# Patient Record
Sex: Male | Born: 1975 | Race: White | Hispanic: No | Marital: Married | Smoking: Never smoker
Health system: Southern US, Community
[De-identification: ages and names within clinical notes are randomized; demographics above are authoritative.]

## PROBLEM LIST (undated history)

## (undated) DIAGNOSIS — M549 Dorsalgia, unspecified: Secondary | ICD-10-CM

## (undated) DIAGNOSIS — G8929 Other chronic pain: Secondary | ICD-10-CM

## (undated) DIAGNOSIS — J302 Other seasonal allergic rhinitis: Secondary | ICD-10-CM

## (undated) HISTORY — PX: BURR HOLE FOR SUBDURAL HEMATOMA: SHX1275

## (undated) HISTORY — PX: TONSILLECTOMY: SUR1361

---

## 2001-03-21 ENCOUNTER — Ambulatory Visit (HOSPITAL_COMMUNITY): Admission: RE | Admit: 2001-03-21 | Discharge: 2001-03-21 | Payer: Self-pay | Admitting: Family Medicine

## 2012-05-29 ENCOUNTER — Encounter (HOSPITAL_BASED_OUTPATIENT_CLINIC_OR_DEPARTMENT_OTHER): Payer: Self-pay | Admitting: Emergency Medicine

## 2012-05-29 ENCOUNTER — Emergency Department (HOSPITAL_BASED_OUTPATIENT_CLINIC_OR_DEPARTMENT_OTHER): Payer: Federal, State, Local not specified - PPO

## 2012-05-29 ENCOUNTER — Emergency Department (HOSPITAL_BASED_OUTPATIENT_CLINIC_OR_DEPARTMENT_OTHER)
Admission: EM | Admit: 2012-05-29 | Discharge: 2012-05-30 | Disposition: A | Discharge status: Home / Self Care | Payer: Federal, State, Local not specified - PPO | Attending: Emergency Medicine | Admitting: Emergency Medicine

## 2012-05-29 DIAGNOSIS — G8929 Other chronic pain: Secondary | ICD-10-CM | POA: Insufficient documentation

## 2012-05-29 DIAGNOSIS — R209 Unspecified disturbances of skin sensation: Secondary | ICD-10-CM | POA: Insufficient documentation

## 2012-05-29 DIAGNOSIS — M549 Dorsalgia, unspecified: Secondary | ICD-10-CM | POA: Insufficient documentation

## 2012-05-29 HISTORY — DX: Other chronic pain: G89.29

## 2012-05-29 HISTORY — DX: Other seasonal allergic rhinitis: J30.2

## 2012-05-29 HISTORY — DX: Dorsalgia, unspecified: M54.9

## 2012-05-29 MED ORDER — METHYLPREDNISOLONE SODIUM SUCC 125 MG IJ SOLR
125.0000 mg | Freq: Once | INTRAMUSCULAR | Status: AC
  Administered 2012-05-29: 125 mg via INTRAMUSCULAR
  Filled 2012-05-29: qty 2

## 2012-05-29 MED ORDER — HYDROMORPHONE HCL PF 2 MG/ML IJ SOLN
2.0000 mg | Freq: Once | INTRAMUSCULAR | Status: AC
  Administered 2012-05-29: 2 mg via INTRAMUSCULAR
  Filled 2012-05-29: qty 1

## 2012-05-29 MED ORDER — KETOROLAC TROMETHAMINE 60 MG/2ML IM SOLN
60.0000 mg | Freq: Once | INTRAMUSCULAR | Status: AC
Start: 2012-05-29 — End: 2012-05-29
  Administered 2012-05-29: 60 mg via INTRAMUSCULAR
  Filled 2012-05-29: qty 2

## 2012-05-29 NOTE — ED Notes (Signed)
Pt c/o lower back pain. Pt states he lifted right leg in shower and felt pop in lower back with pain.

## 2012-05-29 NOTE — ED Provider Notes (Signed)
History     CSN: [id number]  Arrival date & time 05/29/12  2124   None     Chief Complaint  Patient presents with  . Back Injury    (Consider location/radiation/quality/duration/timing/severity/associated sxs/prior treatment) Patient is a 36 y.o. male presenting with back pain. The history is provided by the patient. No language interpreter was used.  Back Pain  This is a new problem. The problem occurs constantly. The problem has been gradually worsening. The pain is associated with no known injury. The pain is present in the lumbar spine. The quality of the pain is described as aching. The pain is at a severity of 6/10. The pain is moderate. The symptoms are aggravated by bending. Associated symptoms include tingling. He has tried nothing for the symptoms.  Pt reports he has a history of sciatica.  Pt reports he lifted his leg in the shower and had severe pain in his back.  Past Medical History  Diagnosis Date  . Chronic back pain   . Seasonal allergies     History reviewed. No pertinent past surgical history.  No family history on file.  History  Substance Use Topics  . Smoking status: Not on file  . Smokeless tobacco: Not on file  . Alcohol Use:       Review of Systems  Musculoskeletal: Positive for back pain.  Neurological: Positive for tingling.  All other systems reviewed and are negative.    Allergies  Amoxil  Home Medications  No current outpatient prescriptions on file.  BP 129/79  Pulse 77  Temp 98.6 F (37 C) (Oral)  Resp 16  Ht 5\' 9"  (1.753 m)  Wt 206 lb (93.441 kg)  BMI 30.42 kg/m2  SpO2 98%  Physical Exam  Nursing note and vitals reviewed. Constitutional: He is oriented to person, place, and time. He appears well-developed and well-nourished.  HENT:  Head: Normocephalic.  Neck: Normal range of motion.  Cardiovascular: Normal rate and normal heart sounds.   Pulmonary/Chest: Effort normal.  Abdominal: Soft.  Musculoskeletal: Normal  range of motion.       Tender lower lumbar spine  nv and ns intact  Neurological: He is alert and oriented to person, place, and time. He has normal reflexes.  Skin: Skin is warm.    ED Course  Procedures (including critical care time)  Labs Reviewed - No data to display Dg Lumbar Spine Complete  05/29/2012  *RADIOLOGY REPORT*  Clinical Data: Low back pain  LUMBAR SPINE - COMPLETE 4+ VIEW  Comparison: None.  Findings: There is no evidence of lumbar spine fracture.  Alignment is normal.  Intervertebral disc spaces are maintained.  IMPRESSION: Negative.   Original Report Authenticated By: Osa Craver, M.D.      No diagnosis found.    MDM  Pt given torodol, dilaudid and solumedrol.    I advised pt to see his Md for recheck on Monday.  Pt given number for Dr. August Saucer for follow up.  Pt given rx for Percocet and voltaren        Lonia Skinner [place], [place] 05/29/12 2341

## 2012-05-30 MED ORDER — OXYCODONE-ACETAMINOPHEN 5-325 MG PO TABS: 2.0000 | ORAL_TABLET | ORAL | Status: DC | PRN

## 2012-05-30 MED ORDER — IBUPROFEN 800 MG PO TABS: 800.0000 mg | ORAL_TABLET | Freq: Three times a day (TID) | ORAL | Status: DC

## 2012-05-30 NOTE — ED Provider Notes (Signed)
Medical screening examination/treatment/procedure(s) were performed by non-physician practitioner and as supervising physician I was immediately available for consultation/collaboration.   Hanley Seamen, MD 05/30/12 [phone]

## 2016-08-16 ENCOUNTER — Emergency Department (HOSPITAL_BASED_OUTPATIENT_CLINIC_OR_DEPARTMENT_OTHER)
Admission: EM | Admit: 2016-08-16 | Discharge: 2016-08-17 | Disposition: A | Discharge status: Home / Self Care | Payer: Federal, State, Local not specified - PPO | Attending: Emergency Medicine | Admitting: Emergency Medicine

## 2016-08-16 ENCOUNTER — Encounter (HOSPITAL_BASED_OUTPATIENT_CLINIC_OR_DEPARTMENT_OTHER): Payer: Self-pay | Admitting: Emergency Medicine

## 2016-08-16 DIAGNOSIS — F1290 Cannabis use, unspecified, uncomplicated: Secondary | ICD-10-CM

## 2016-08-16 DIAGNOSIS — R410 Disorientation, unspecified: Secondary | ICD-10-CM | POA: Insufficient documentation

## 2016-08-16 DIAGNOSIS — Z79899 Other long term (current) drug therapy: Secondary | ICD-10-CM | POA: Insufficient documentation

## 2016-08-16 DIAGNOSIS — R0789 Other chest pain: Secondary | ICD-10-CM | POA: Diagnosis not present

## 2016-08-16 DIAGNOSIS — F1210 Cannabis abuse, uncomplicated: Secondary | ICD-10-CM | POA: Insufficient documentation

## 2016-08-16 DIAGNOSIS — R42 Dizziness and giddiness: Secondary | ICD-10-CM | POA: Diagnosis not present

## 2016-08-16 DIAGNOSIS — R079 Chest pain, unspecified: Secondary | ICD-10-CM

## 2016-08-16 MED ORDER — LORAZEPAM 1 MG PO TABS: 1.0000 mg | ORAL_TABLET | Freq: Once | ORAL | Status: DC

## 2016-08-16 NOTE — ED Triage Notes (Signed)
Patient states that he got a "rice krispie treat with mariajuana in it"  [employer] today. He states that about an hour after he consumed it he has had chest burning and pain, wife reports that he is disoriented.

## 2016-08-16 NOTE — ED Provider Notes (Signed)
[facility] Provider Note   CSN: [id number] Arrival date & time: 08/16/16  2300  By signing my name below, I, Nathan Monroe, attest that this documentation has been prepared under the direction and in the presence of Nathan Batonourtney F Yazir Koerber, MD . Electronically Signed: Modena JanskyAlbert Monroe, Scribe. 08/16/2016. 11:33 PM.  History   Chief Complaint Chief Complaint  Patient presents with  . Chest Pain   The history is provided by the patient. No language interpreter was used.   HPI Comments: Nathan Monroe is a 40 y.o. male who presents to the Emergency Department complaining of constant moderate chest pain that started about 2 hours ago. He states he ate a "rice krispie with marijuana in it". He has no prior experience with the drug so he is concerned about his symptoms. He currently rates the pain as a 3/10. He reports associated dizziness and confusion. He states his symptoms are gradually improving with no modifying factors. He denies any hx of cardiac problems or other complaints.   Past Medical History:  Diagnosis Date  . Chronic back pain   . Seasonal allergies     There are no active problems to display for this patient.   History reviewed. No pertinent surgical history.     Home Medications    Prior to Admission medications   Medication Sig Start Date End Date Taking? Authorizing Provider  ibuprofen (ADVIL,MOTRIN) 800 MG tablet Take 1 tablet (800 mg total) by mouth 3 (three) times daily. 05/30/12   Elson AreasLeslie K Sofia, PA-C  oxyCODONE-acetaminophen (PERCOCET/ROXICET) 5-325 MG per tablet Take 2 tablets by mouth every 4 (four) hours as needed for pain. 05/30/12   Elson AreasLeslie K Sofia, PA-C    Family History No family history on file.  Social History Social History  Substance Use Topics  . Smoking status: Never Smoker  . Smokeless tobacco: Never Used  . Alcohol use Yes     Comment: occ     Allergies   Amoxil [amoxicillin]   Review of Systems Review of Systems    Constitutional: Negative for fever.  Respiratory: Negative for shortness of breath.   Cardiovascular: Positive for chest pain.  Gastrointestinal: Negative for abdominal pain, nausea and vomiting.  Neurological: Positive for dizziness.  Psychiatric/Behavioral: Positive for confusion.  All other systems reviewed and are negative.    Physical Exam Updated Vital Signs Ht 5\' 7"  (1.702 m)   Wt 210 lb (95.3 kg)   BMI 32.89 kg/m   Physical Exam  Constitutional: He is oriented to person, place, and time. He appears well-developed and well-nourished. No distress.  HENT:  Head: Normocephalic and atraumatic.  Eyes: Pupils are equal, round, and reactive to light.  Pupils 5 mm and reactive bilaterally  Cardiovascular: Normal rate, regular rhythm and normal heart sounds.   No murmur heard. Pulmonary/Chest: Effort normal and breath sounds normal. No respiratory distress. He has no wheezes. He exhibits no tenderness.  Abdominal: Soft. There is no tenderness.  Musculoskeletal: He exhibits no edema.  Neurological: He is alert and oriented to person, place, and time.  Skin: Skin is warm and dry.  Psychiatric: He has a normal mood and affect.  Nursing note and vitals reviewed.    ED Treatments / Results  DIAGNOSTIC STUDIES:   COORDINATION OF CARE: 11:40 PM- Pt advised of plan for treatment and pt agrees.  Labs (all labs ordered are listed, but only abnormal results are displayed) Labs Reviewed  RAPID URINE DRUG SCREEN, HOSP PERFORMED - Abnormal; Notable for the  following:       Result Value   Tetrahydrocannabinol POSITIVE (*)    All other components within normal limits  TROPONIN I    EKG  EKG Interpretation  Date/Time:  Saturday August 16 2016 23:11:07 EST Ventricular Rate:  105 PR Interval:    QRS Duration: 92 QT Interval:  314 QTC Calculation: 415 R Axis:   93 Text Interpretation:  Sinus tachycardia Borderline right axis deviation Confirmed by Wilkie AyeHORTON  MD, Thu Baggett  [phone]([id number]) on 08/16/2016 11:34:37 PM       Radiology Dg Chest 2 View  Result Date: 08/17/2016 CLINICAL DATA:  Chest pain after eating. EXAM: CHEST  2 VIEW COMPARISON:  None. FINDINGS: The heart size and mediastinal contours are within normal limits. Both lungs are clear. The visualized skeletal structures are unremarkable. IMPRESSION: No active cardiopulmonary disease. Electronically Signed   By: Nathan Monroe M.D.   On: 08/17/2016 00:15    Procedures Procedures (including critical care time)  Medications Ordered in ED Medications  LORazepam (ATIVAN) tablet 1 mg (not administered)     Initial Impression / Assessment and Plan / ED Course  I have reviewed the triage vital signs and the nursing notes.  Pertinent labs & imaging results that were available during my care of the patient were reviewed by me and considered in my medical decision making (see chart for details).  Clinical Course     Patient presents with chest pain, dizziness, and confusion. This started after ingesting marijuana. He is nontoxic. Low-risk ACS. Sinus tachycardia on exam but endorses anxiety. Low suspicion for PE. EKG is nonischemic and chest x-ray is reassuring. Positive THC on UDS. Patient offered Ativan but declined. Feels symptoms are likely related to marijuana ingestion.  After history, exam, and medical workup I feel the patient has been appropriately medically screened and is safe for discharge home. Pertinent diagnoses were discussed with the patient. Patient was given return precautions.   Final Clinical Impressions(s) / ED Diagnoses   Final diagnoses:  Chest pain, unspecified type  Marijuana use    New Prescriptions New Prescriptions   No medications on file   I personally performed the services described in this documentation, which was scribed in my presence. The recorded information has been reviewed and is accurate.     Nathan Batonourtney F Nathan Kromer, MD 08/17/16 [phone]

## 2016-08-17 ENCOUNTER — Emergency Department (HOSPITAL_BASED_OUTPATIENT_CLINIC_OR_DEPARTMENT_OTHER): Payer: Federal, State, Local not specified - PPO

## 2016-08-17 LAB — RAPID URINE DRUG SCREEN, HOSP PERFORMED
Amphetamines: NOT DETECTED
Barbiturates: NOT DETECTED
Benzodiazepines: NOT DETECTED
Cocaine: NOT DETECTED
OPIATES: NOT DETECTED
Tetrahydrocannabinol: POSITIVE — AB

## 2016-08-17 LAB — TROPONIN I

## 2016-08-17 NOTE — ED Notes (Signed)
Removed I.V. in right A.C. no issues at this time.

## 2016-08-17 NOTE — ED Notes (Signed)
Offered Ativan tablet to pt per order, pt declines at this time.

## 2016-08-17 NOTE — Discharge Instructions (Signed)
Your symptoms today were likely related to your marijuana use.

## 2017-07-14 IMAGING — CR DG CHEST 2V
2 series · 2 of 2 positions shown · non-contrast
Comparison: None.

CLINICAL DATA: Chest pain after eating.

EXAM:
CHEST  2 VIEW

[w chest pa]
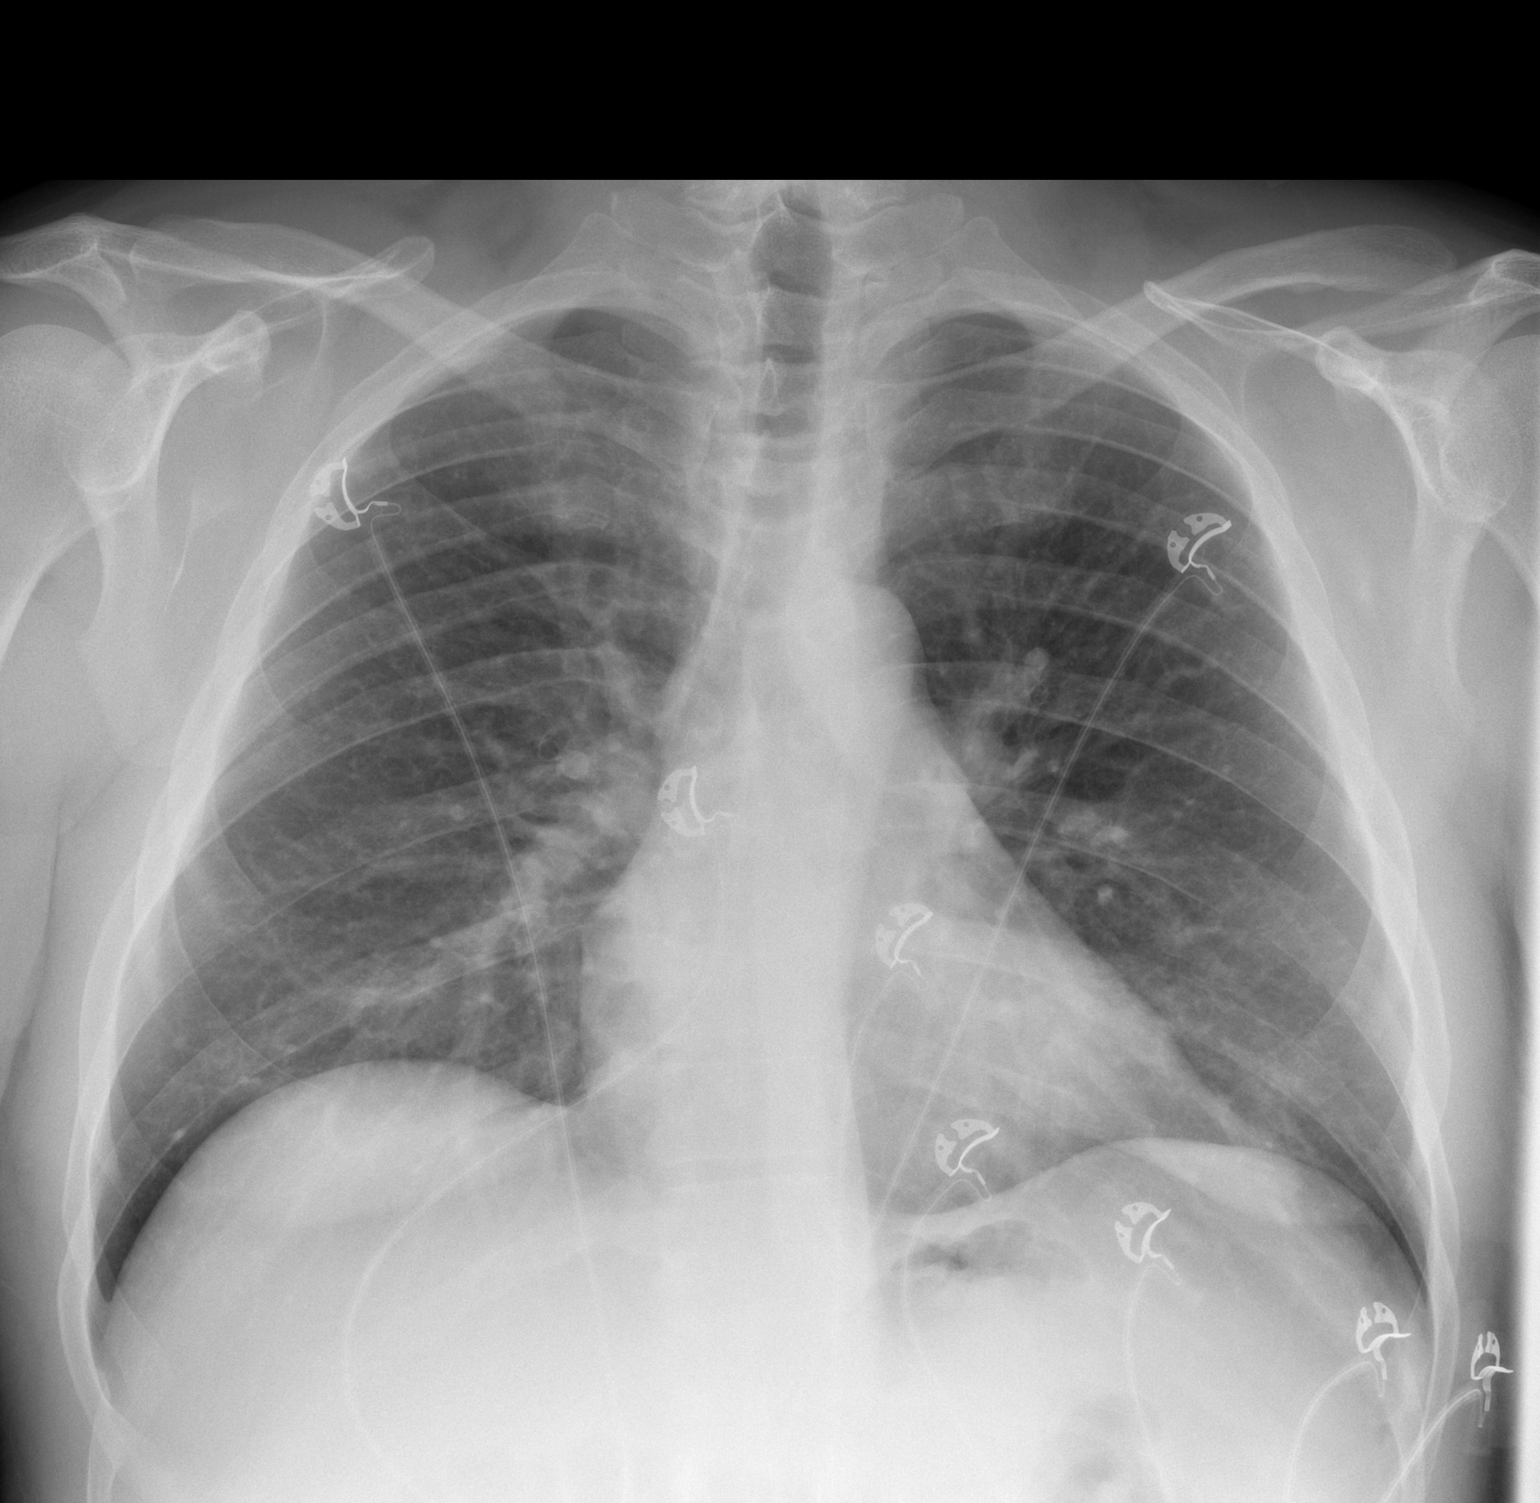

[w chest lat]
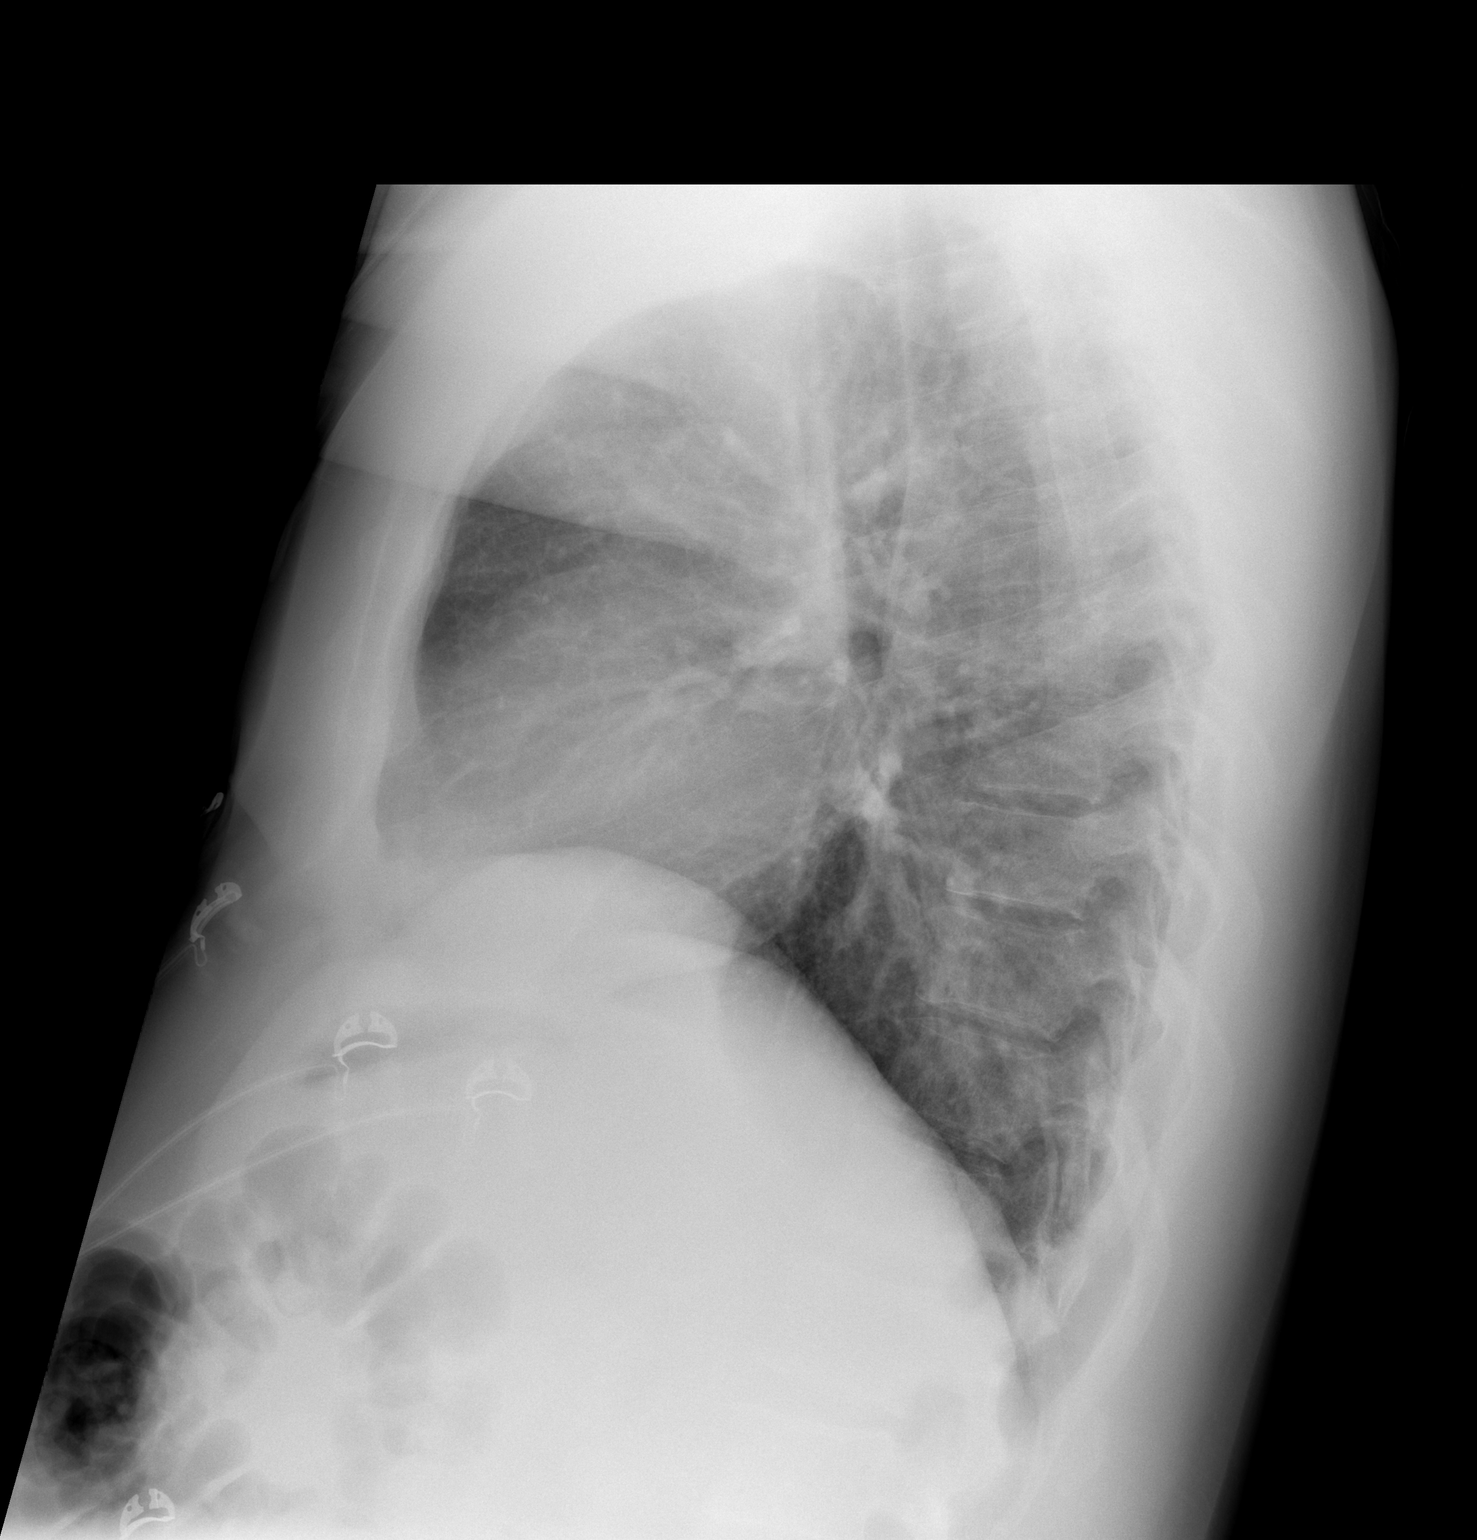

[2 of 2 positions shown; findings below may reference images not displayed]

FINDINGS: The heart size and mediastinal contours are within normal limits.
Both lungs are clear. The visualized skeletal structures are
unremarkable.
IMPRESSION: No active cardiopulmonary disease.

## 2018-11-01 ENCOUNTER — Encounter: Payer: Self-pay | Admitting: Neurology

## 2018-11-03 ENCOUNTER — Encounter: Payer: Self-pay | Admitting: Neurology

## 2018-11-03 ENCOUNTER — Other Ambulatory Visit: Payer: Self-pay

## 2018-11-03 ENCOUNTER — Ambulatory Visit: Payer: Federal, State, Local not specified - PPO | Admitting: Neurology

## 2018-11-03 VITALS — BP 122/84 | HR 83 | Ht 68.0 in | Wt 205.0 lb

## 2018-11-03 DIAGNOSIS — F514 Sleep terrors [night terrors]: Secondary | ICD-10-CM

## 2018-11-03 DIAGNOSIS — G4727 Circadian rhythm sleep disorder in conditions classified elsewhere: Secondary | ICD-10-CM | POA: Diagnosis not present
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
## 2019-02-07 ENCOUNTER — Telehealth: Payer: Self-pay

## 2019-02-07 NOTE — Telephone Encounter (Signed)
We have attempted to call the patient two times to schedule sleep study.  Patient has been unavailable at the phone numbers we have on file and has not returned our calls.  We have left detailed messages asking for a return call back asap.  If patient calls back we will schedule them for their sleep study.

## 2020-01-12 ENCOUNTER — Ambulatory Visit: Payer: Federal, State, Local not specified - PPO | Admitting: Allergy and Immunology

## 2020-04-27 ENCOUNTER — Ambulatory Visit: Payer: Self-pay | Admitting: *Deleted

## 2020-04-27 NOTE — Telephone Encounter (Signed)
Caller has questions about O2 levels due to + covid. C/o cough and nasal congestion persisting 11 days now. O2 levels at 95-97% on RA per patient. Denies SOB. Care advise given. Isolation precautions reviewed. Patient verbalized understanding of care advise and to call back or go to [facility] or ED if symptoms worsen.   Reason for Disposition . Long COVID (e.g., post-COVID syndrome), questions about  Answer Assessment - Initial Assessment Questions 1. COVID-19 ONSET: "When did the symptoms of COVID-19 first start?"     Greater than 11 days ago  3. MAIN SYMPTOM:  "What is your main concern or symptom right now?" (e.g., breathing difficulty, cough, fatigue. loss of smell)     When should I be concerned about my oxygen levels? 5. BETTER-SAME-WORSE: "Are you getting better, staying the same, or getting worse over the last 1 to 2 weeks?"     A little better continues to cough and have congestion 6. RECENT MEDICAL VISIT: "Have you been seen by a healthcare provider (doctor, NP, PA) for these persisting COVID-19 symptoms?" If Yes, ask: "When were you seen?" (e.g., date)     na 7. COUGH: "Do you have a cough?" If Yes, ask: "How bad is the cough?"       Yes. Bad at times 8. FEVER: "Do you have a fever?" If Yes, ask: "What is your temperature, how was it measured, and when did it start?"     na 9. BREATHING DIFFICULTY: "Are you having any trouble breathing?" If Yes, ask: "How bad is your breathing?" (e.g., mild, moderate, severe)    - MILD: No SOB at rest, mild SOB with walking, speaks normally in sentences, can lay down, no retractions, pulse < 100.    - MODERATE: SOB at rest, SOB with minimal exertion and prefers to sit, cannot lie down flat, speaks in phrases, mild retractions, audible wheezing, pulse 100-120.    - SEVERE: Very SOB at rest, speaks in single words, struggling to breathe, sitting hunched forward, retractions, pulse > 120       no  12. OTHER SYMPTOMS: "Do you have any other symptoms?"  (e.g.,  fatigue, headache, muscle pain, weakness)       Cough , nasal congestion  Protocols used: CORONAVIRUS (COVID-19) PERSISTING SYMPTOMS FOLLOW-UP CALL-A-AH
